# Patient Record
Sex: Male | Born: 2000 | Race: Black or African American | Hispanic: No | Marital: Single | Smoking: Never smoker
Health system: Southern US, Community
[De-identification: ages and names within clinical notes are randomized; demographics above are authoritative.]

---
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
## 2021-09-05 ENCOUNTER — Encounter (HOSPITAL_BASED_OUTPATIENT_CLINIC_OR_DEPARTMENT_OTHER): Payer: Self-pay

## 2021-09-05 ENCOUNTER — Other Ambulatory Visit: Payer: Self-pay

## 2021-09-05 DIAGNOSIS — R519 Headache, unspecified: Secondary | ICD-10-CM | POA: Diagnosis present

## 2021-09-05 DIAGNOSIS — Z20822 Contact with and (suspected) exposure to covid-19: Secondary | ICD-10-CM | POA: Diagnosis not present

## 2021-09-05 DIAGNOSIS — B9789 Other viral agents as the cause of diseases classified elsewhere: Secondary | ICD-10-CM | POA: Insufficient documentation

## 2021-09-05 DIAGNOSIS — J069 Acute upper respiratory infection, unspecified: Secondary | ICD-10-CM | POA: Insufficient documentation

## 2021-09-05 LAB — RESP PANEL BY RT-PCR (FLU A&B, COVID) ARPGX2
Influenza A by PCR: NEGATIVE
Influenza B by PCR: NEGATIVE
SARS Coronavirus 2 by RT PCR: NEGATIVE

## 2021-09-05 NOTE — ED Triage Notes (Signed)
Patient here POV from Home. ? ?Endorses Headache, Fever, Nasal Drainage, Congestion for approximately 3-4 Days. ? ?101 Fever at Home. No Cough. No Sore Throat. ? ?NAD Noted during Triage. A&Ox4. GCS 15. Ambulatory. ?

## 2021-09-06 ENCOUNTER — Emergency Department (HOSPITAL_BASED_OUTPATIENT_CLINIC_OR_DEPARTMENT_OTHER)
Admission: EM | Admit: 2021-09-06 | Discharge: 2021-09-06 | Disposition: A | Discharge status: Home / Self Care | Payer: Medicaid Other | Attending: Emergency Medicine | Admitting: Emergency Medicine

## 2021-09-06 DIAGNOSIS — J069 Acute upper respiratory infection, unspecified: Secondary | ICD-10-CM

## 2021-09-06 NOTE — Discharge Instructions (Signed)
You were seen today for upper respiratory symptoms and congestion.  Use Flonase or nasal saline for congestion.  Use over-the-counter Tylenol or ibuprofen.  Make sure that you are staying hydrated. ?

## 2021-09-06 NOTE — ED Provider Notes (Signed)
?[facility] EMERGENCY DEPT ?Provider Note ? ? ?CSN: [id number] ?Arrival date & time: 09/05/21  2223 ? ?  ? ?History ? ?Chief Complaint  ?Patient presents with  ? Nasal Congestion  ? ? ?Bryan Orr is a 21 y.o. male. ? ?HPI ? ?  ? ?This is a 21 year old male who presents with 2 to 3-day history of chills, sinus pressure, nasal drainage and congestion, headache.  Patient has not taken anything for his symptoms.  Reports fever at home to 101.  He did not take anything.  Temperature here 99.1.  He denies any cough.  No nausea, vomiting.  No sick contacts or COVID exposures.  He is concerned he may have a sinus infection. ? ?Home Medications ?Prior to Admission medications   ?Not on File  ?   ? ?Allergies    ?Patient has no known allergies.   ? ?Review of Systems   ?Review of Systems  ?Constitutional:  Positive for chills.  ?HENT:  Positive for congestion and sinus pressure.   ?Neurological:  Positive for headaches.  ?All other systems reviewed and are negative. ? ?Physical Exam ?Updated Vital Signs ?BP (!) 147/94 (BP Location: Right Arm)   Pulse (!) 116   Temp 99.1 ?F (37.3 ?C)   Resp 18   Ht 1.88 m (6\' 2" )   Wt 108.9 kg   SpO2 100%   BMI 30.82 kg/m?  ?Physical Exam ?Vitals and nursing note reviewed.  ?Constitutional:   ?   Appearance: He is well-developed. He is obese. He is not ill-appearing.  ?HENT:  ?   Head: Normocephalic and atraumatic.  ?   Nose: Congestion present.  ?   Mouth/Throat:  ?   Mouth: Mucous membranes are moist.  ?   Pharynx: No oropharyngeal exudate or posterior oropharyngeal erythema.  ?Eyes:  ?   Pupils: Pupils are equal, round, and reactive to light.  ?Cardiovascular:  ?   Rate and Rhythm: Normal rate and regular rhythm.  ?   Heart sounds: Normal heart sounds. No murmur heard. ?Pulmonary:  ?   Effort: Pulmonary effort is normal. No respiratory distress.  ?   Breath sounds: Normal breath sounds. No wheezing.  ?Abdominal:  ?   General: Bowel sounds are normal.  ?   Palpations:  Abdomen is soft.  ?   Tenderness: There is no abdominal tenderness. There is no rebound.  ?Musculoskeletal:  ?   Cervical back: Normal range of motion and neck supple.  ?Lymphadenopathy:  ?   Cervical: No cervical adenopathy.  ?Skin: ?   General: Skin is warm and dry.  ?Neurological:  ?   Mental Status: He is alert and oriented to person, place, and time.  ?Psychiatric:     ?   Mood and Affect: Mood normal.  ? ? ?ED Results / Procedures / Treatments   ?Labs ?(all labs ordered are listed, but only abnormal results are displayed) ?Labs Reviewed  ?RESP PANEL BY RT-PCR (FLU A&B, COVID) [id number]  ? ? ?EKG ?None ? ?Radiology ?No results found. ? ?Procedures ?Procedures  ? ? ?Medications Ordered in ED ?Medications - No data to display ? ?ED Course/ Medical Decision Making/ A&P ?  ?                        ?Medical Decision Making ? ?This patient presents to the ED for concern of upper respiratory symptoms, this involves an extensive number of treatment options, and is a complaint that carries with  it a high risk of complications and morbidity.  The differential diagnosis includes upper respiratory viral infection, COVID, influenza, early sinus infection, less likely bacterial pneumonia ? ?MDM:   ? ?This is a 21 year old male who presents with upper respiratory symptoms.  He is nontoxic and vital signs are reassuring.  Initially was tachycardic to 116.  This self resolved to 92.  He is afebrile here.  Significant congestion on exam but otherwise largely reassuring.  COVID and influenza testing were sent and are negative.  Suspect other viral etiology.  Would favor this over seasonal allergies given reported chills and temperature at home.  Recommend supportive measures including nasal saline, Flonase, hydration.  He can use Tylenol or ibuprofen as needed for fever or chills. ?Admission considered for upper respiratory symptoms ?(Labs, imaging, consults) ? ?Labs: ?I Ordered, and personally interpreted labs.  The pertinent  results include: Negative COVID and influenza ? ?Imaging Studies ordered: ?I ordered imaging studies including none ?I independently visualized and interpreted imaging. ?I agree with the radiologist interpretation ? ?Additional history obtained from chart review.  External records from outside source obtained and reviewed including prior visits ? ?Cardiac Monitoring: ?The patient was maintained on a cardiac monitor.  I personally viewed and interpreted the cardiac monitored which showed an underlying rhythm of: Sinus rhythm ? ?Reevaluation: ?After the interventions noted above, I reevaluated the patient and found that they have :stayed the same ? ?Social Determinants of Health: ?Lives independently ? ?Disposition:  discharge ? ?Co morbidities that complicate the patient evaluation ?History reviewed. No pertinent past medical history.  ? ?Medicines ?No orders of the defined types were placed in this encounter. ?  ?I have reviewed the patients home medicines and have made adjustments as needed ? ?Problem List / ED Course: ?Problem List Items Addressed This Visit   ?None ?Visit Diagnoses   ? ? Viral URI    -  Primary  ? ?  ?  ? ? ? ? ? ? ? ? ? ? ? ? ?Final Clinical Impression(s) / ED Diagnoses ?Final diagnoses:  ?Viral URI  ? ? ?Rx / DC Orders ?ED Discharge Orders   ? ? None  ? ?  ? ? ?  ?Shon Baton, MD ?09/06/21 0155 ? ?
